# Patient Record
Sex: Male | Born: 1950 | Hispanic: No | Marital: Single | Smoking: Never smoker
Health system: Southern US, Community
[De-identification: ages and names within clinical notes are randomized; demographics above are authoritative.]

---

## 2013-07-11 ENCOUNTER — Encounter (HOSPITAL_COMMUNITY): Payer: Self-pay | Admitting: Emergency Medicine

## 2013-07-11 ENCOUNTER — Emergency Department (HOSPITAL_COMMUNITY): Payer: No Typology Code available for payment source

## 2013-07-11 ENCOUNTER — Emergency Department (HOSPITAL_COMMUNITY)
Admission: EM | Admit: 2013-07-11 | Discharge: 2013-07-11 | Disposition: A | Discharge status: Home / Self Care | Payer: No Typology Code available for payment source | Attending: Emergency Medicine | Admitting: Emergency Medicine

## 2013-07-11 DIAGNOSIS — N5082 Scrotal pain: Secondary | ICD-10-CM

## 2013-07-11 DIAGNOSIS — Z79899 Other long term (current) drug therapy: Secondary | ICD-10-CM | POA: Insufficient documentation

## 2013-07-11 DIAGNOSIS — N453 Epididymo-orchitis: Secondary | ICD-10-CM | POA: Insufficient documentation

## 2013-07-11 DIAGNOSIS — Z791 Long term (current) use of non-steroidal anti-inflammatories (NSAID): Secondary | ICD-10-CM | POA: Insufficient documentation

## 2013-07-11 DIAGNOSIS — N451 Epididymitis: Secondary | ICD-10-CM

## 2013-07-11 LAB — URINALYSIS, ROUTINE W REFLEX MICROSCOPIC
Bilirubin Urine: NEGATIVE
Glucose, UA: NEGATIVE mg/dL
Hgb urine dipstick: NEGATIVE
Ketones, ur: NEGATIVE mg/dL
Leukocytes, UA: NEGATIVE
Nitrite: NEGATIVE
Protein, ur: NEGATIVE mg/dL
Specific Gravity, Urine: 1.026 (ref 1.005–1.030)
Urobilinogen, UA: 1 mg/dL (ref 0.0–1.0)
pH: 6.5 (ref 5.0–8.0)

## 2013-07-11 LAB — GC/CHLAMYDIA PROBE AMP
CT PROBE, AMP APTIMA: NEGATIVE
GC Probe RNA: NEGATIVE

## 2013-07-11 MED ORDER — HYDROCODONE-ACETAMINOPHEN 5-325 MG PO TABS: 1.0000 | ORAL_TABLET | Freq: Four times a day (QID) | ORAL | Status: AC | PRN

## 2013-07-11 MED ORDER — HYDROCODONE-ACETAMINOPHEN 5-325 MG PO TABS
1.0000 | ORAL_TABLET | Freq: Once | ORAL | Status: AC
Start: 2013-07-11 — End: 2013-07-11
  Administered 2013-07-11: 1 via ORAL
  Filled 2013-07-11: qty 1

## 2013-07-11 MED ORDER — IBUPROFEN 200 MG PO TABS
600.0000 mg | ORAL_TABLET | Freq: Once | ORAL | Status: AC
  Administered 2013-07-11: 600 mg via ORAL
  Filled 2013-07-11: qty 3

## 2013-07-11 MED ORDER — IBUPROFEN 600 MG PO TABS: 600.0000 mg | ORAL_TABLET | Freq: Four times a day (QID) | ORAL | Status: AC

## 2013-07-11 NOTE — Discharge Instructions (Signed)
Epididymitis Epididymitis is a swelling (inflammation) of the epididymis. The epididymis is a cord-like structure along the back part of the testicle. Epididymitis is usually, but not always, caused by infection. This is usually a sudden problem beginning with chills, fever and pain behind the scrotum and in the testicle. There may be swelling and redness of the testicle. DIAGNOSIS  Physical examination will reveal a tender, swollen epididymis. Sometimes, cultures are obtained from the urine or from prostate secretions to help find out if there is an infection or if the cause is a different problem. Sometimes, blood work is performed to see if your white blood cell count is elevated and if a germ (bacterial) or viral infection is present. Using this knowledge, an appropriate medicine which kills germs (antibiotic) can be chosen by your caregiver. A viral infection causing epididymitis will most often go away (resolve) without treatment. HOME CARE INSTRUCTIONS   Hot sitz baths for 20 minutes, 4 times per day, may help relieve pain.  Only take over-the-counter or prescription medicines for pain, discomfort or fever as directed by your caregiver.  Take all medicines, including antibiotics, as directed. Take the antibiotics for the full prescribed length of time even if you are feeling better.  It is very important to keep all follow-up appointments. SEEK IMMEDIATE MEDICAL CARE IF:   You have a fever.  You have pain not relieved with medicines.  You have any worsening of your problems.  Your pain seems to come and go.  You develop pain, redness, and swelling in the scrotum and surrounding areas. MAKE SURE YOU:   Understand these instructions.  Will watch your condition.  Will get help right away if you are not doing well or get worse. Document Released: 03/13/2000 Document Revised: 06/08/2011 Document Reviewed: 01/31/2009 [facility] Patient Information 2014 [employer], [place].  You were also  found to have a scrotal mass. He will be given the radiology report. You need [place] [facility] [place] [facility] [place] [place].

## 2013-07-11 NOTE — ED Notes (Signed)
MD at bedside. 

## 2013-07-11 NOTE — ED Notes (Signed)
Patient is alert and oriented x3.  He is complaining of testicular pain that started last night. He denies any trauma to the area to initiate this issue or any history of this issue. Currently he rates his pain 4 of 10 resting.  9 of 10 with palpation.

## 2013-07-11 NOTE — ED Provider Notes (Signed)
CSN: [id number]     Arrival date & time 07/11/13  0244 History   First MD Initiated Contact with Patient 07/11/13 [phone]     Chief Complaint  Patient presents with  . Testicle Pain     (Consider location/radiation/quality/duration/timing/severity/associated sxs/prior Treatment) HPI  This is a 63 year old male who presents with right testicle pain. Patient reports onset of pain greater than 24 hours ago. He states that it comes and goes. Nothing seems to make it better or worse. He denies any dysuria, hematuria or penile discharge. He denies any new sexual contacts. Patient currently rates his pain at 4/10. Acetaminophen did not help. Patient states that pain increases to 9/10 especially with movement.  History reviewed. No pertinent past medical history. History reviewed. No pertinent past surgical history. History reviewed. No pertinent family history. History  Substance Use Topics  . Smoking status: Never Smoker   . Smokeless tobacco: Not on file  . Alcohol Use: Yes    Review of Systems  Constitutional: Negative for fever.  Gastrointestinal: Negative for abdominal pain and constipation.  Genitourinary: Positive for testicular pain. Negative for dysuria, urgency, penile swelling and penile pain.  All other systems reviewed and are negative.     Allergies  Review of patient's allergies indicates no known allergies.  Home Medications   Current Outpatient Rx  Name  Route  Sig  Dispense  Refill  . acetaminophen (TYLENOL) 500 MG tablet   Oral   Take 1,000 mg by mouth every 6 (six) hours as needed (pain).         Marland Kitchen. lisinopril-hydrochlorothiazide (PRINZIDE,ZESTORETIC) 10-12.5 MG per tablet   Oral   Take 1 tablet by mouth daily.         Marland Kitchen. HYDROcodone-acetaminophen (NORCO/VICODIN) 5-325 MG per tablet   Oral   Take 1 tablet by mouth every 6 (six) hours as needed for moderate pain.   15 tablet   0   . ibuprofen (ADVIL,MOTRIN) 600 MG tablet   Oral   Take 1 tablet (600  mg total) by mouth 4 (four) times daily.   30 tablet   0    BP 156/92  Pulse 78  Temp(Src) 98.8 F (37.1 C) (Oral)  Resp 16  SpO2 99% Physical Exam  Nursing note and vitals reviewed. Constitutional: He is oriented to person, place, and time. He appears well-developed and well-nourished.  HENT:  Head: Normocephalic and atraumatic.  Eyes: Pupils are equal, round, and reactive to light.  Neck: Neck supple.  Cardiovascular: Normal rate and regular rhythm.   Pulmonary/Chest: Effort normal. No respiratory distress.  Abdominal: Soft. There is no tenderness.  Genitourinary:  Normal penis, tenderness palpation of the right testicle upon palpation, positive cremasteric reflex, no crepitus noted, no masses noted  Musculoskeletal: He exhibits no edema.  Lymphadenopathy:    He has no cervical adenopathy.  Neurological: He is alert and oriented to person, place, and time.  Skin: Skin is warm and dry.  Psychiatric: He has a normal mood and affect.    ED Course  Procedures (including critical care time) Labs Review Labs Reviewed  GC/CHLAMYDIA PROBE AMP  URINE CULTURE  URINALYSIS, ROUTINE W REFLEX MICROSCOPIC   Imaging Review [place] Scrotum  07/11/2013   CLINICAL DATA:  Right testicular pain  EXAM: SCROTAL ULTRASOUND  DOPPLER ULTRASOUND OF THE TESTICLES  TECHNIQUE: Complete ultrasound examination of the testicles, epididymis, and other scrotal structures was performed. Color and spectral Doppler ultrasound were also utilized to evaluate blood flow to the testicles.  COMPARISON:  None.  FINDINGS: Right testicle  Measurements: 4.6 x 2.5 x 3.4 cm. There is a 1.8 x 0.9 x 1.1 cm echogenic area within the right testicle with peripheral rim of hypo echogenicity.  Left testicle  Measurements: 4.4 x 2.7 x 3.2 cm. No mass or microlithiasis visualized.  Right epididymis:  Normal in size and appearance.  Left epididymis:  Normal in size and appearance.  Hydrocele:  There is a small right hydrocele.   Varicocele:  None visualized.  Pulsed Doppler interrogation of both testes demonstrates low resistance arterial and venous waveforms bilaterally.  IMPRESSION: 1. There is a 1.8 x 0.9 x 1.1 cm echogenic lesion in the right testicle with a peripheral rim of hypo echogenicity. This may reflect sequela of trauma versus a testicular mass. Urology consultation is recommended. 2. No active testicular torsion.   Electronically Signed   By: Elige KoHetal  Patel   On: 07/11/2013 05:27   [place] Art/ven Flow Abd Pelv Doppler  07/11/2013   CLINICAL DATA:  Right testicular pain  EXAM: SCROTAL ULTRASOUND  DOPPLER ULTRASOUND OF THE TESTICLES  TECHNIQUE: Complete ultrasound examination of the testicles, epididymis, and other scrotal structures was performed. Color and spectral Doppler ultrasound were also utilized to evaluate blood flow to the testicles.  COMPARISON:  None.  FINDINGS: Right testicle  Measurements: 4.6 x 2.5 x 3.4 cm. There is a 1.8 x 0.9 x 1.1 cm echogenic area within the right testicle with peripheral rim of hypo echogenicity.  Left testicle  Measurements: 4.4 x 2.7 x 3.2 cm. No mass or microlithiasis visualized.  Right epididymis:  Normal in size and appearance.  Left epididymis:  Normal in size and appearance.  Hydrocele:  There is a small right hydrocele.  Varicocele:  None visualized.  Pulsed Doppler interrogation of both testes demonstrates low resistance arterial and venous waveforms bilaterally.  IMPRESSION: 1. There is a 1.8 x 0.9 x 1.1 cm echogenic lesion in the right testicle with a peripheral rim of hypo echogenicity. This may reflect sequela of trauma versus a testicular mass. Urology consultation is recommended. 2. No active testicular torsion.   Electronically Signed   By: Elige KoHetal  Patel   On: 07/11/2013 05:27     EKG Interpretation None      MDM   Final diagnoses:  Epididymitis  Scrotal pain    Patient presents with right testicular pain for the last 24 hours. He is nontoxic on exam. There is no  evidence of mass or hernia. There is tenderness to palpation. There no overlying skin changes or evidence of crepitus suggestive of 50. Doppler ultrasound is negative for acute torsion. Echogenic lesion was noted that may reflect trauma versus testicular mass.  Patient was informed of these findings. Urinalysis is without evidence of infection. GC Chlamydia is pending. Patient states that he has not concern for STDs and does not want treatment at this time. Will treat him for acute epididymitis.  Patient was encouraged to use anti-inflammatories and wear good supportive underwear. Patient has elected [facility] [place] [facility] [place] regarding ultrasound findings. He was given a copy of the ultrasound findings.  After history, exam, and medical workup I feel the patient has been appropriately medically screened and is safe for discharge home. Pertinent diagnoses were discussed with the patient. Patient was given return precautions.     Shon Batonourtney F Elianny Buxbaum, MD 07/11/13 [phone]

## 2013-07-11 NOTE — ED Notes (Signed)
US at bedside

## 2013-07-12 LAB — URINE CULTURE

## 2014-11-06 IMAGING — US US ART/VEN ABD/PELV/SCROTUM DOPPLER LTD
1 series · 14 of 25 positions shown · non-contrast
Comparison: None.

CLINICAL DATA: Right testicular pain

EXAM:
SCROTAL ULTRASOUND
DOPPLER ULTRASOUND OF THE TESTICLES
TECHNIQUE: Complete ultrasound examination of the testicles, epididymis, and
other scrotal structures was performed. Color and spectral Doppler
ultrasound were also utilized to evaluate blood flow to the
testicles.

[Series 1: us art/ven abd/pelv/scrotum doppler ltd · 0.06mm/px · 14 of 37 slices shown]
[im 1/37]
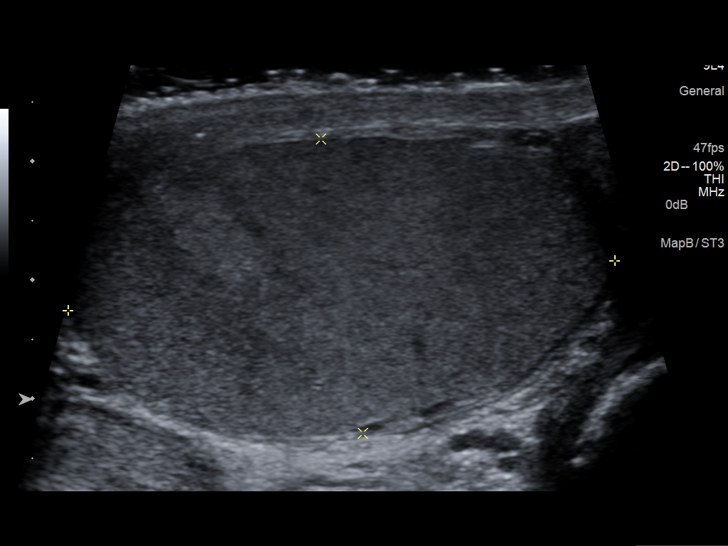
[im 4/37]
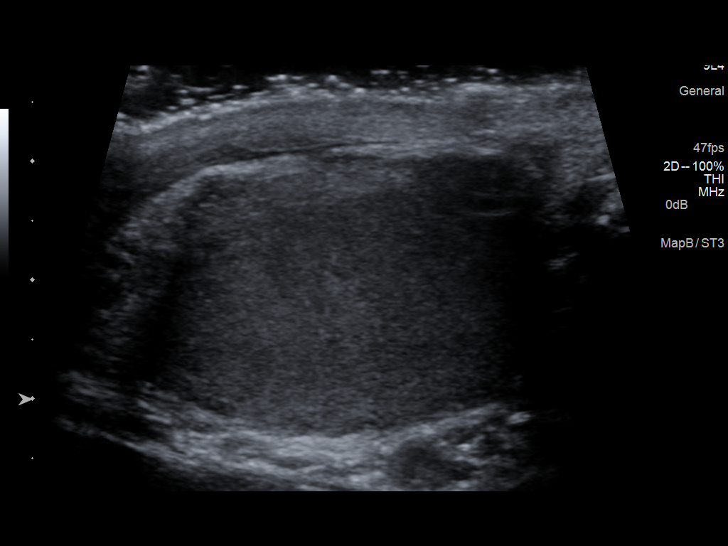
[im 7/37]
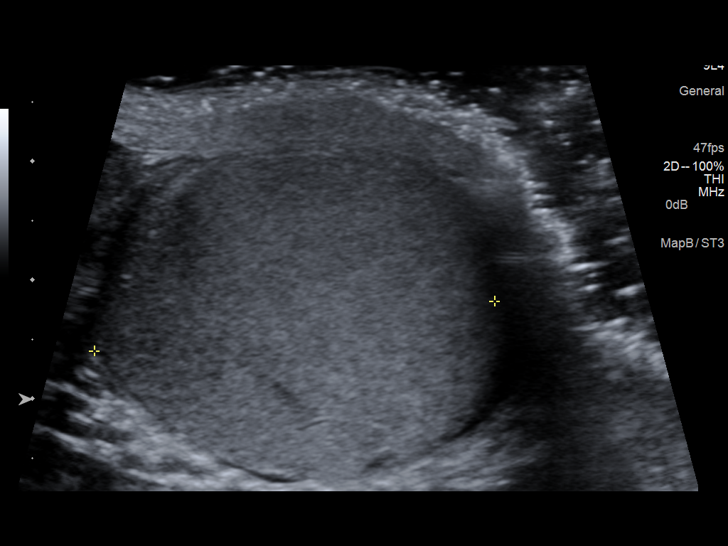
[im 10/37]
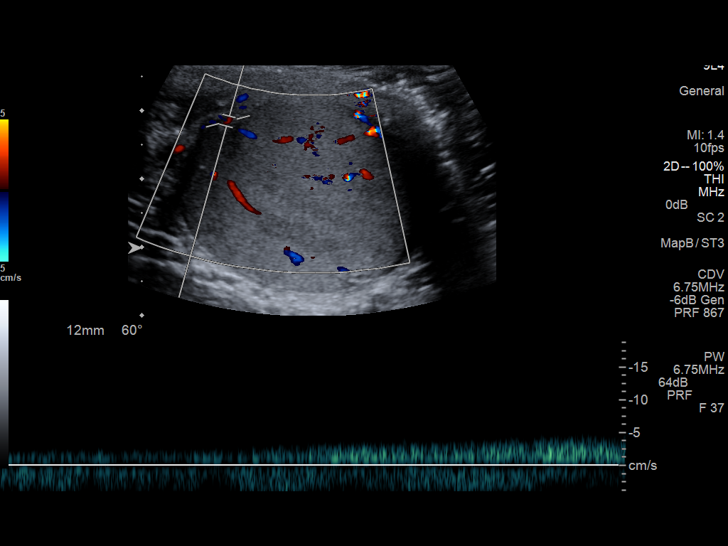
[im 13/37]
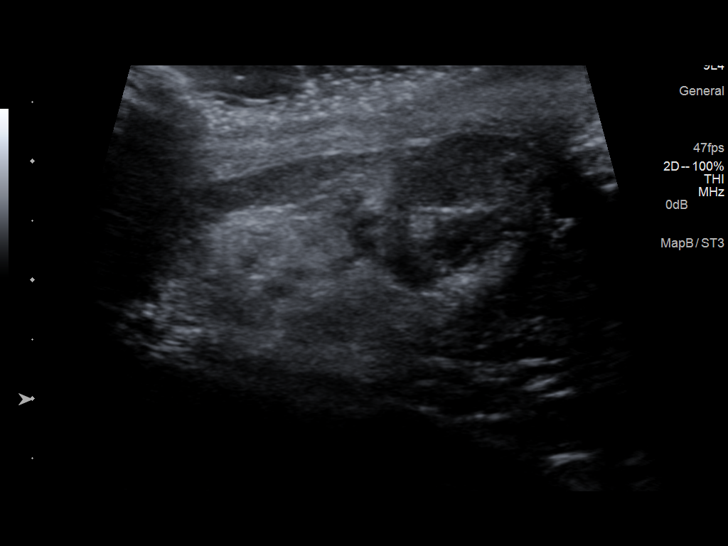
[im 14/37]
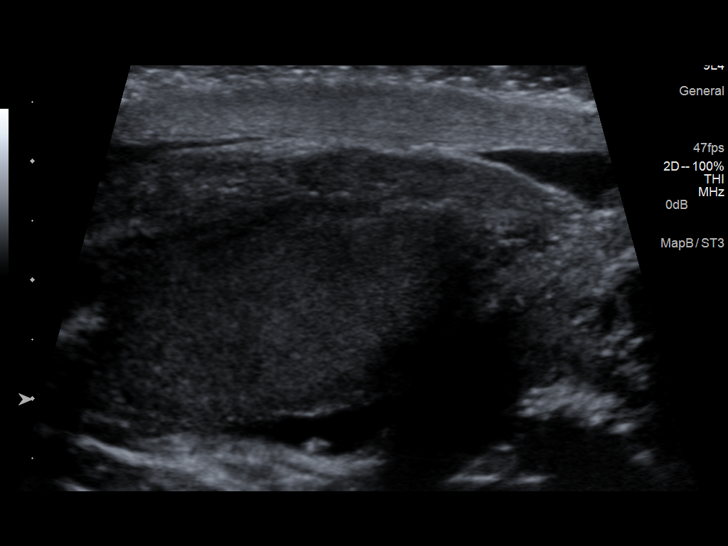
[im 17/37]
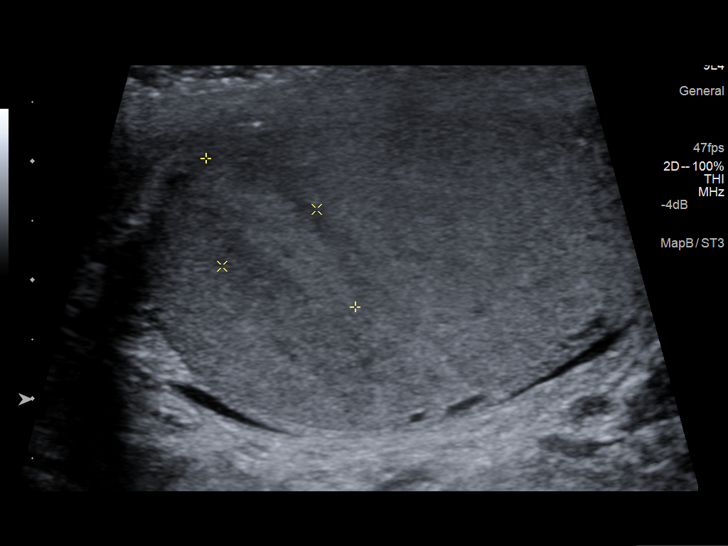
[im 20/37]
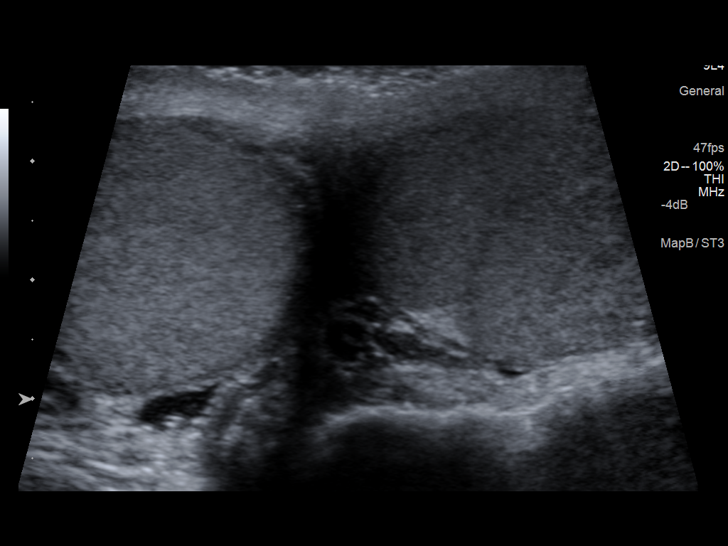
[im 23/37]
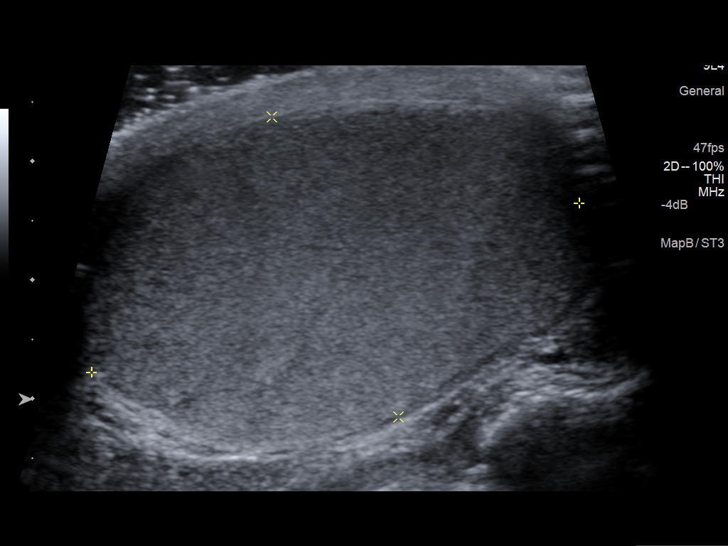
[im 25/37]
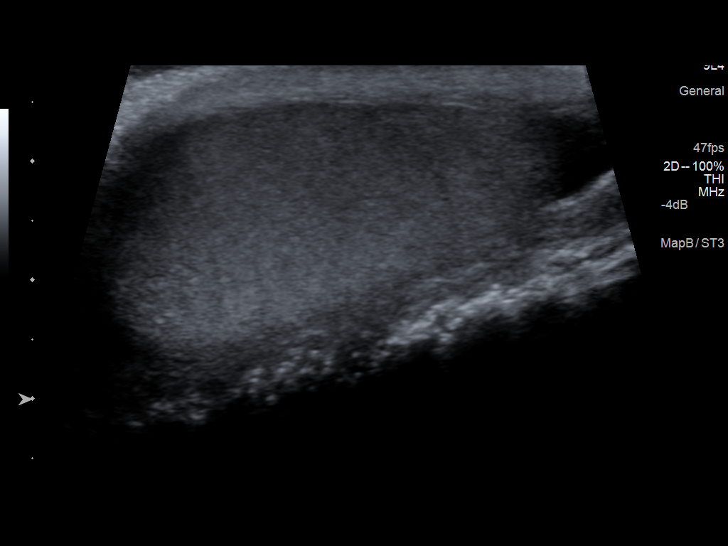
[im 28/37]
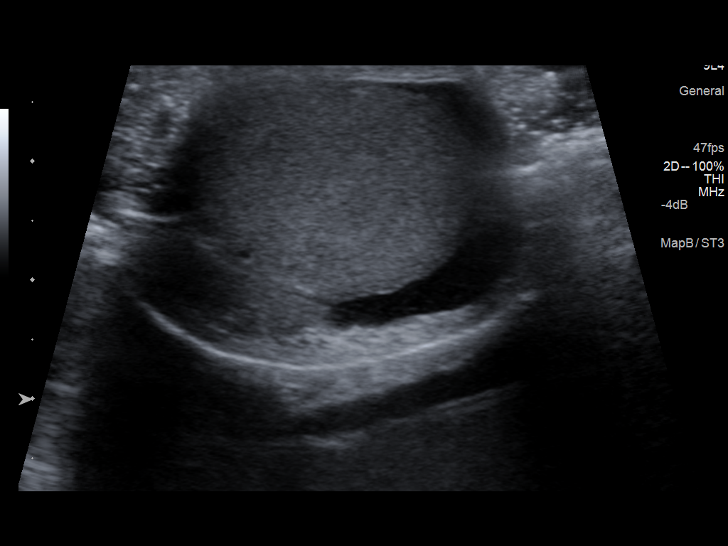
[im 31/37]
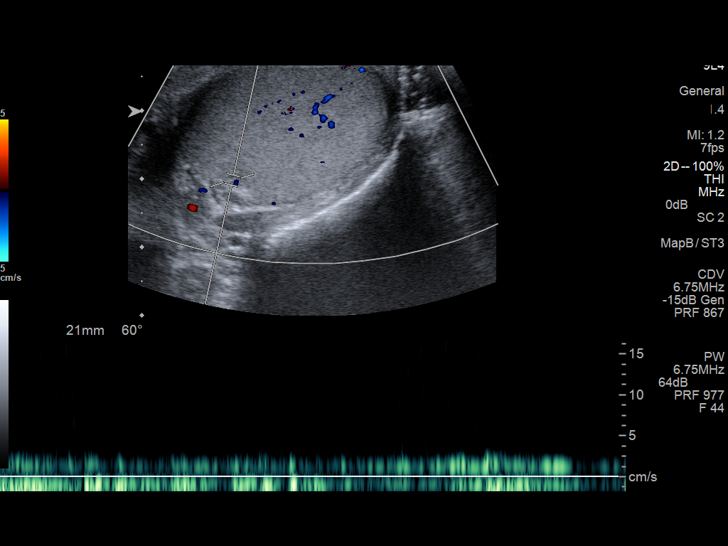
[im 34/37]
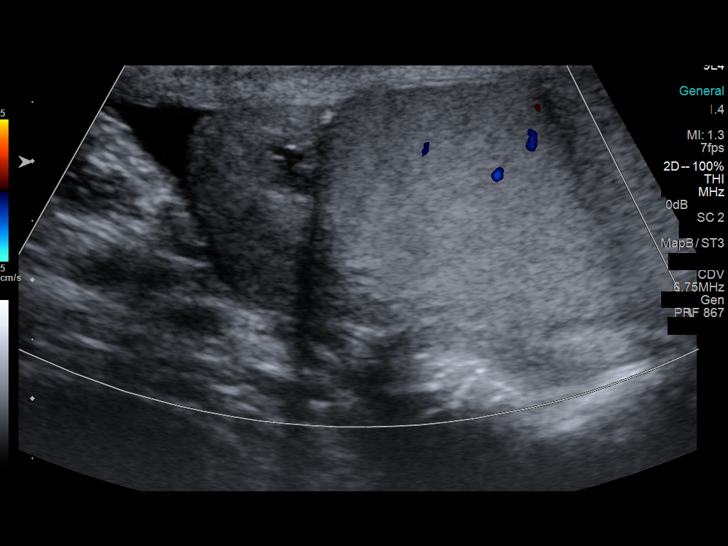
[im 37/37]
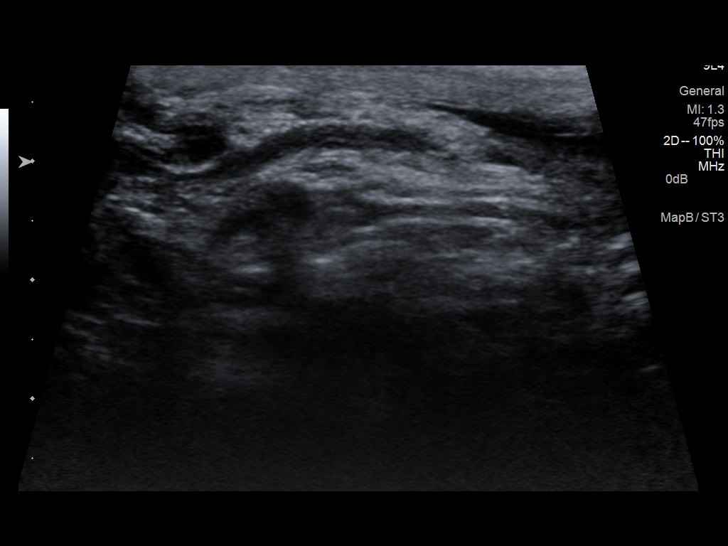

[14 of 25 positions shown; findings below may reference images not displayed]

FINDINGS: Right testicle

Measurements: 4.6 x 2.5 x 3.4 cm. There is a 1.8 x 0.9 x 1.1 cm
echogenic area within the right testicle with peripheral rim of hypo
echogenicity.

Left testicle

Measurements: 4.4 x 2.7 x 3.2 cm. No mass or microlithiasis
visualized.

Right epididymis:  Normal in size and appearance.

Left epididymis:  Normal in size and appearance.

Hydrocele:  There is a small right hydrocele.

Varicocele:  None visualized.

Pulsed Doppler interrogation of both testes demonstrates low
resistance arterial and venous waveforms bilaterally.
IMPRESSION: 1. There is a 1.8 x 0.9 x 1.1 cm echogenic lesion in the right
testicle with a peripheral rim of hypo echogenicity. This may
reflect sequela of trauma versus a testicular mass. Urology
consultation is recommended.
2. No active testicular torsion.
